# Patient Record
Sex: Female | Born: 1950
Health system: Northeastern US, Academic
[De-identification: ages and names within clinical notes are randomized; demographics above are authoritative.]

---

## 2004-02-29 IMAGING — US US PELVIS COMPLETE
2 series · 14 of 25 positions shown · non-contrast
Comparison: none

CLINICAL DATA: Abnormal vaginal bleeding for three weeks with a hemoglobin of 7.8.  LMP 09/17/03.  The patient has had passage of clots with her bleeding.
PELVIC ULTRASOUND:
The patient could not tolerate an endovaginal exam and this study was performed transabdominally only.
Multiple images of the uterus and adnexal were obtained.  
The uterus is enlarged with a maximal sagittal length of 12.9 cm and maximal AP width of 6.2 cm.  The endometrial canal is distended by a mixture of fluid and mixed echogenicity soft tissue.  The overall appearance suggests the presence of retained clot within the endometrial canal.  The visualized portion of the lining itself appears thin, however the evaluation of the entire lining was limited by the transabdominal only approach.   areas of focally altered echotexture compatible with fibroids are identified.  One is in the anterior upper uterine segment measuring 3.5 x 3.1 x 3.2 cm.  Because of the distended endometrial canal this fibroids relationship with the mucosal lining can be seen and approximately 50% of the fibroid appears to have a submucosal component.  A second fibroid is identified in the posterior mid-body measuring 3.2 x 2.8 x 2.7 cm.  This fibroid as well appears to have an approximate 50% submucosal component.  A third smaller fibroid is identified in the anterior mid-body measuring 1.3 x 1.0 x 1.0 cm and appearing mural and a fourth mural fibroid is suggested in the posterior lower uterine segment measuring 2.8 x 2.2 x 2.6 cm.  MRI may be helpful for evaluation of the uterus and fibroid sizes and locations, as well as better visualization of the endometrial lining, as the patient was unable to tolerate the vaginal exam.  
The right ovary has a normal appearance measuring 2.6 x 2.2 x 3.3 cm.  The left ovary contains a unilocular simple cyst measuring 3.6 x 3.7 x 3.7 cm.  This has a benign appearance and follow-up evaluation in 4 to 6 weeks is recommended for initial short-term reassessment of this suspected benign process.  
IMPRESSION
Enlarged uterus with a distended endometrial canal containing fluid and mixed echogenicity soft tissues suggesting the presence of retained clot.  
Four focal fibroids with sizes and locations as noted above.  At least two of the fibroids appear to have a substantial submucosal component and may have some degree of relationship with the patient?s vaginal bleeding and apparent large amount of intraluminal clot.  
Simple left ovarian cyst.  Follow-up is recommended in 4 to 6 weeks for initial short-term reassessment.  Normal left ovary.  
The cervix was incompletely evaluated due to the transabdominal only approach.  t does not appear enlarged transabdominally and there is no obvious explanation for the retained clot seen on this study.

[Series 1: unknown · 0.30mm/px · 9 of 26 slices shown]
[im 1/26]
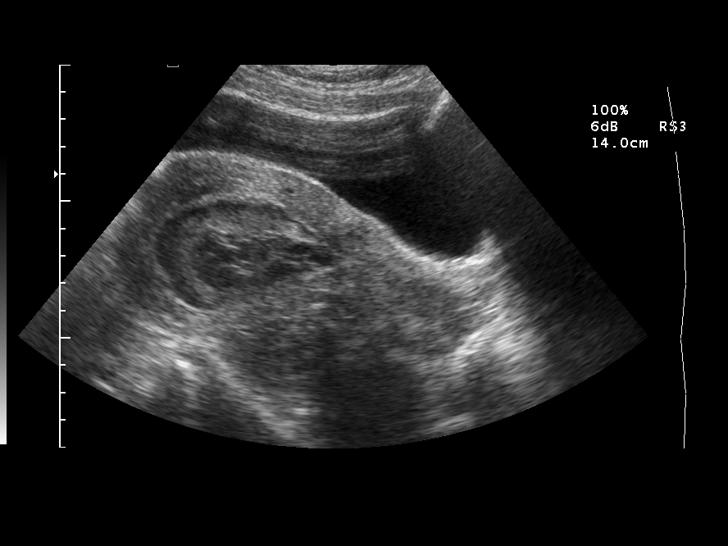
[im 4/26]
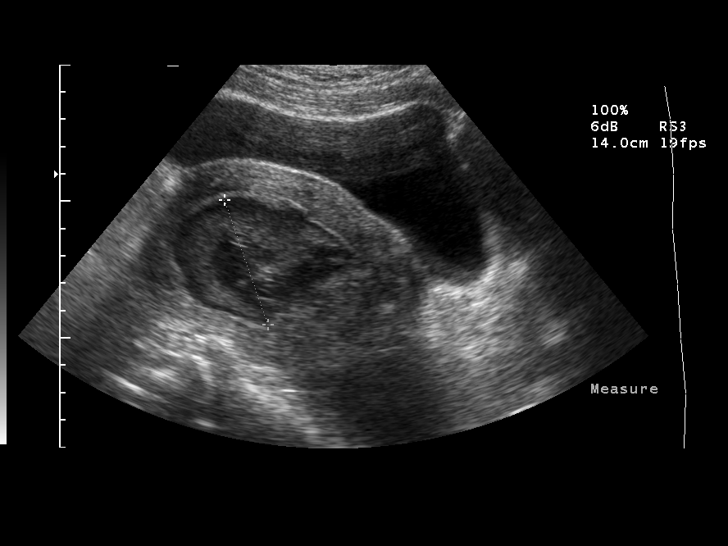
[im 7/26]
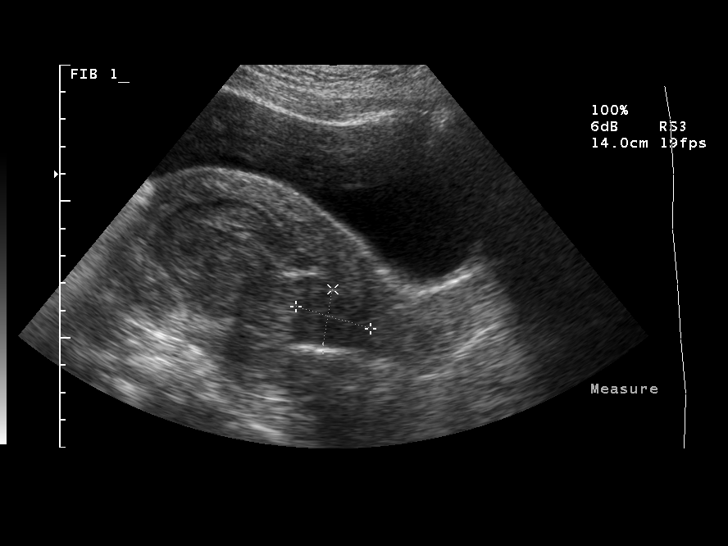
[im 11/26]
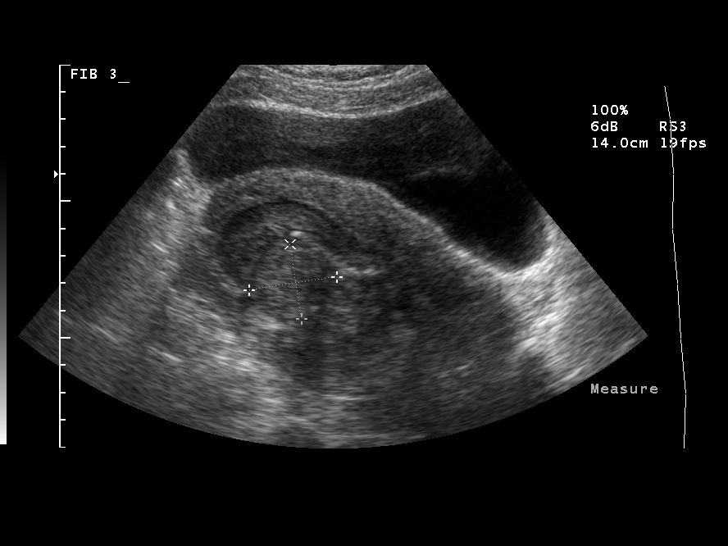
[im 14/26]
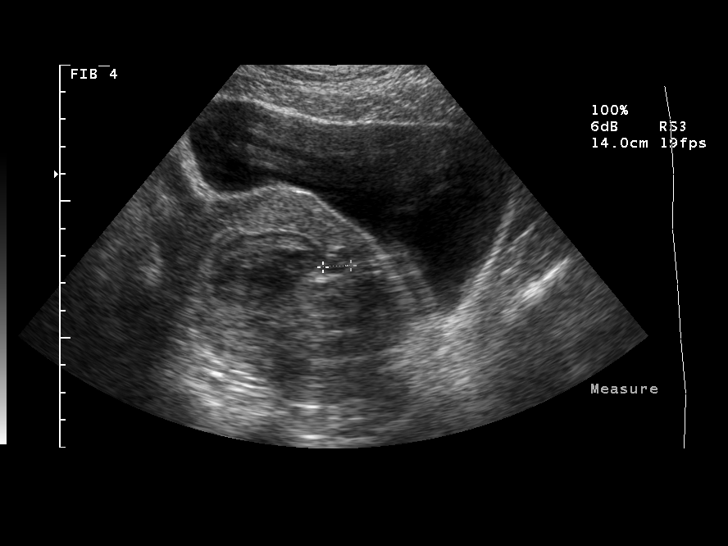
[im 16/26]
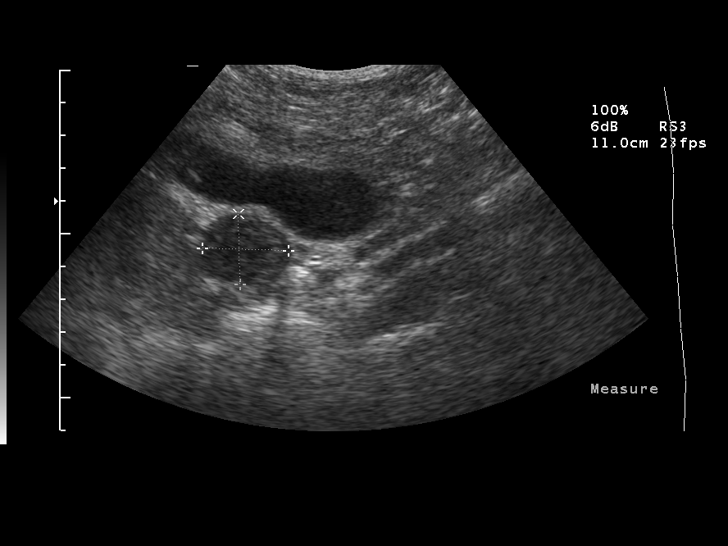
[im 19/26]
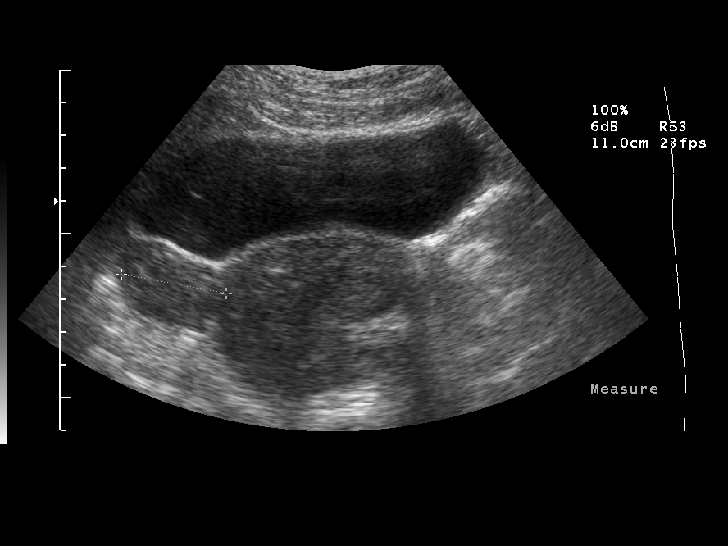
[im 22/26]
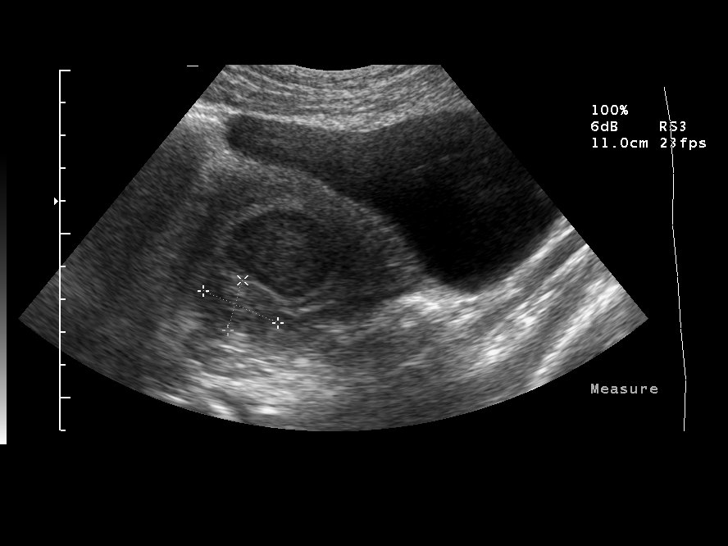
[im 26/26]
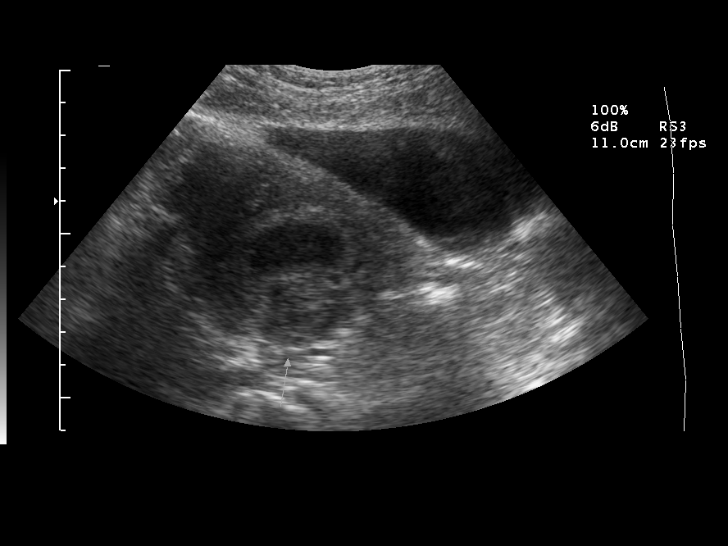

[Series 1: us transvaginal non-ob · 5 of 15 slices shown]
[im 1/15]
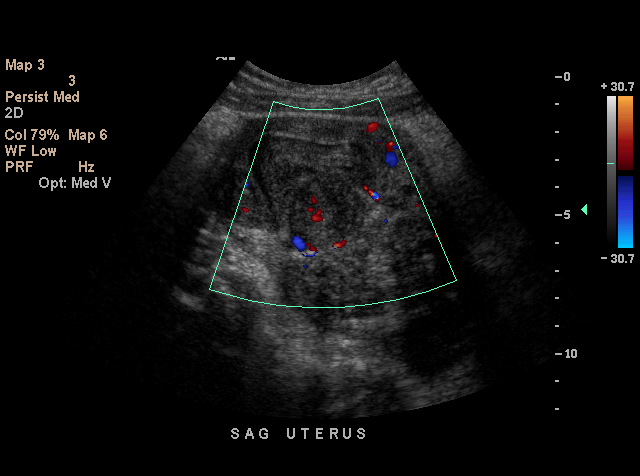
[im 4/15]
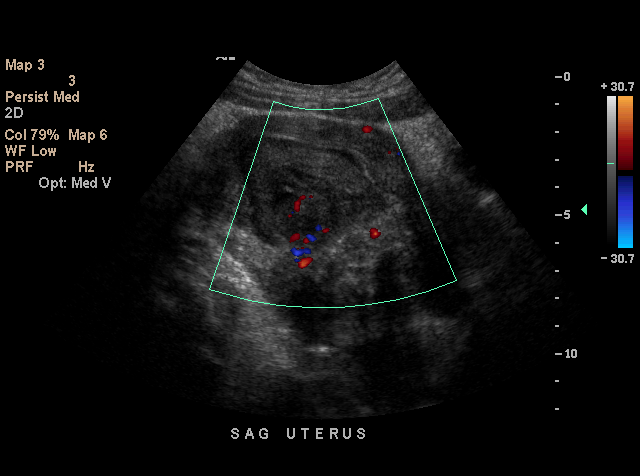
[im 8/15]
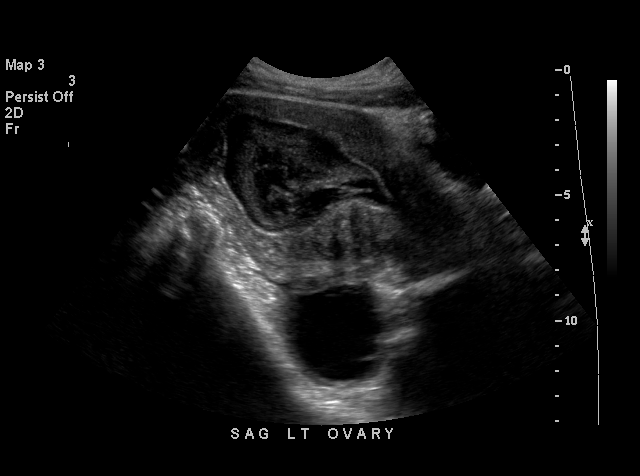
[im 11/15]
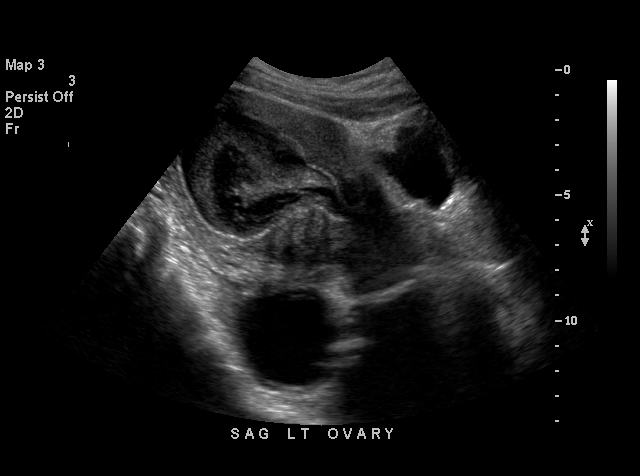
[im 15/15]
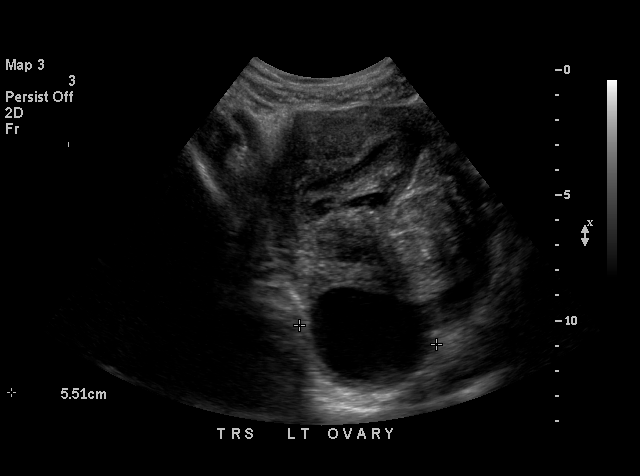

[14 of 25 positions shown; findings below may reference images not displayed]

## 2004-03-12 IMAGING — US US TRANSVAGINAL NON-OB
1 series · 18 of 25 positions shown · non-contrast
Comparison: none

CLINICAL DATA: 52-year-old with fibroids.  Menorrhagia.  Status post transfusion for vaginal bleeding.  
 TRANSABDOMINAL AND TRANSVAGINAL PELVIC ULTRASOUND:
 Comparison 10/05/03.
 Transabdominal and endovaginal images are performed of the pelvis.  
 There is increased echogenic endometrial clot on the current study, making it more difficult to identified a discrete fibroid.  However, two central fundal fibroids are again identified in the upper uterus, not significantly changed in appearance.  Smaller lower anterior fibroid is also seen.  On today?s exam, there is a pedunculated right anterior fibroid, in the mid region of the uterus measuring 2.6 x 2.1 x 2.0 cm. 
 The left ovary is 4.3 x 1.2 x 2.7 cm.  Small cysts are seen on the left.  The largest measures 2.7 cm on today?s exam, showing decrease in size since the prior exam.  The right ovary is felt to be located posterior along the inferior aspect of the uterus, lateral to the left ovary measuring 3.6 x 1.8 x 2.1 cm.  No free pelvic fluid is identified.  
 IMPRESSION
 Multiple uterine fibroids.  There has decrease in size of left ovarian cyst.  No definite necrotic or infarcted fibroids are identified.

[Series 1: us pelvis complete · 18 of 58 slices shown]
[im 1/58]
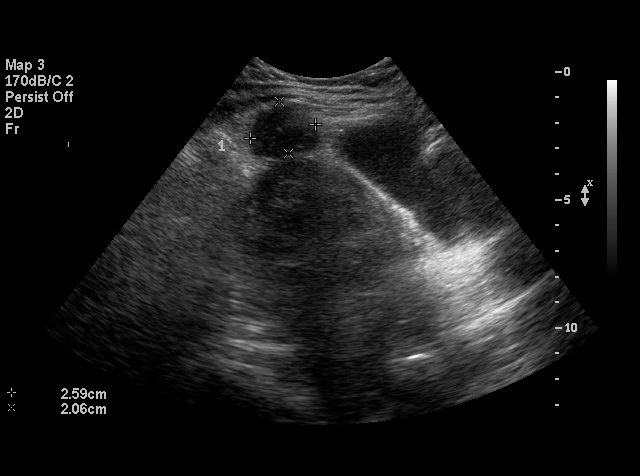
[im 5/58]
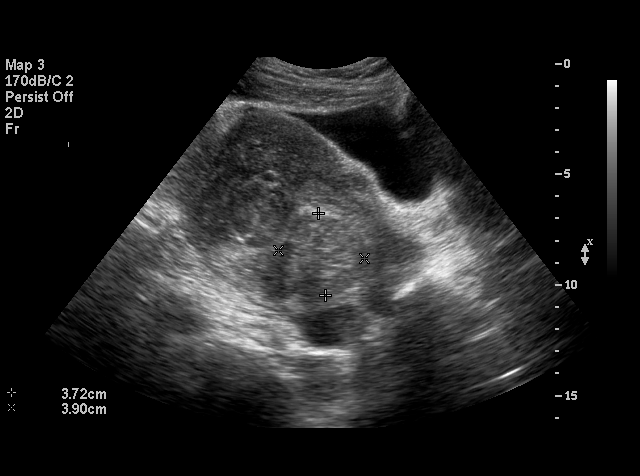
[im 8/58]
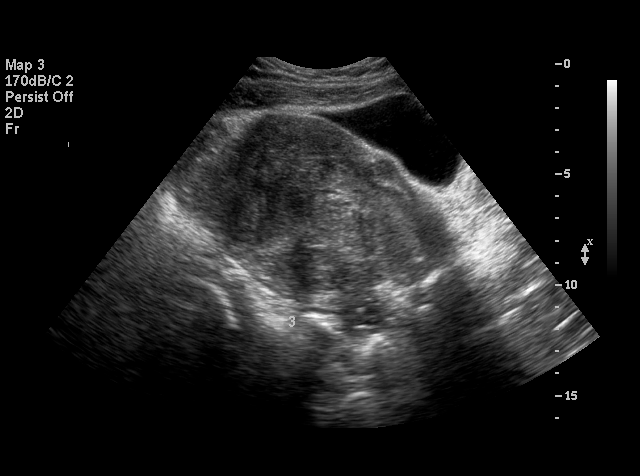
[im 10/58]
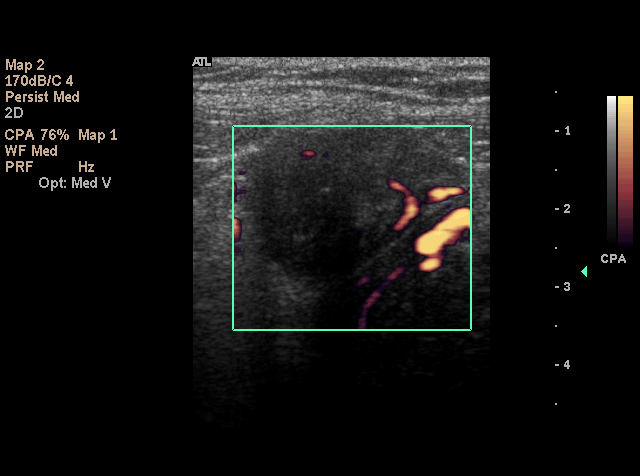
[im 15/58]
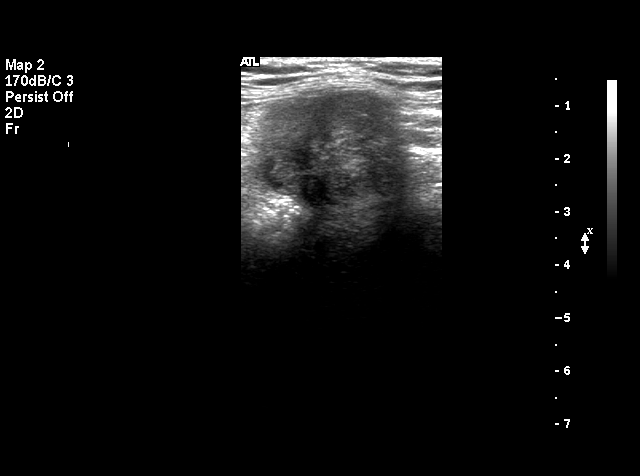
[im 17/58]
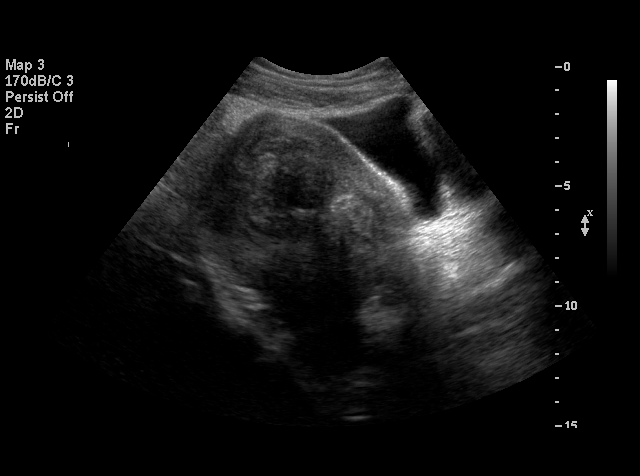
[im 22/58]
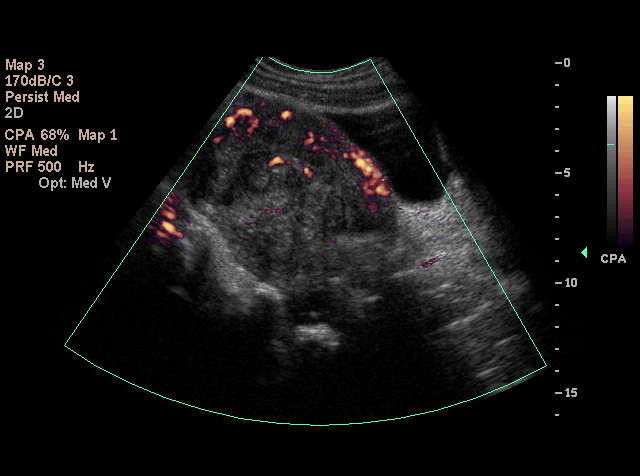
[im 24/58]
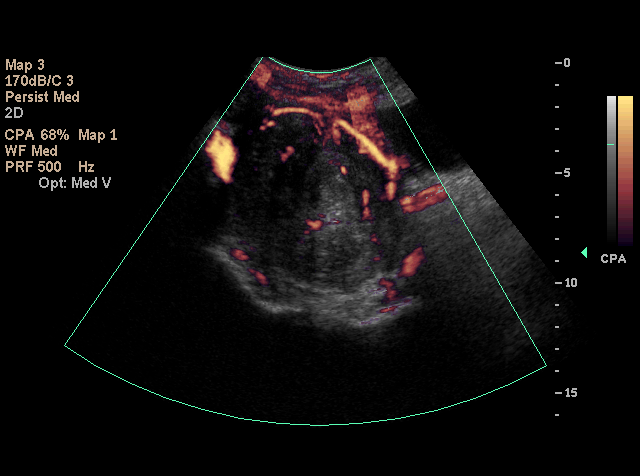
[im 27/58]
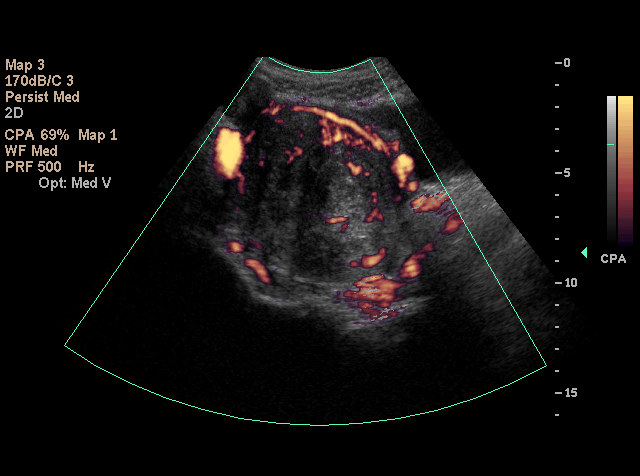
[im 31/58]
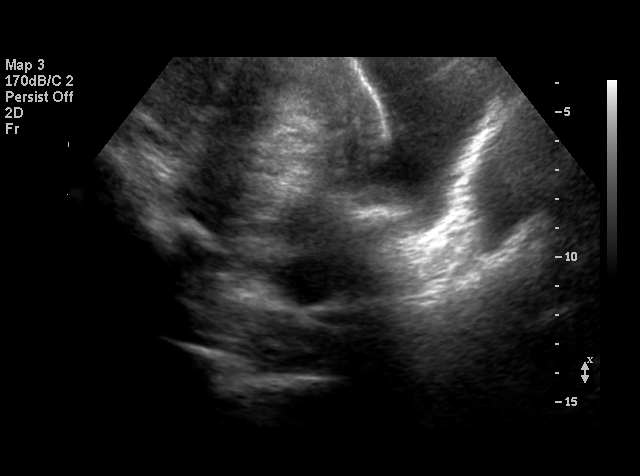
[im 34/58]
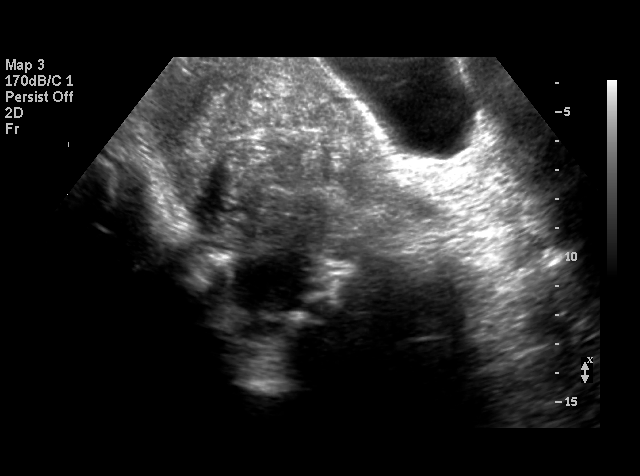
[im 36/58]
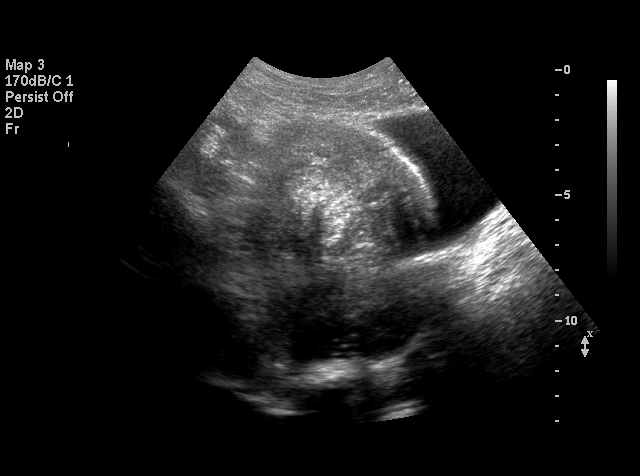
[im 41/58]
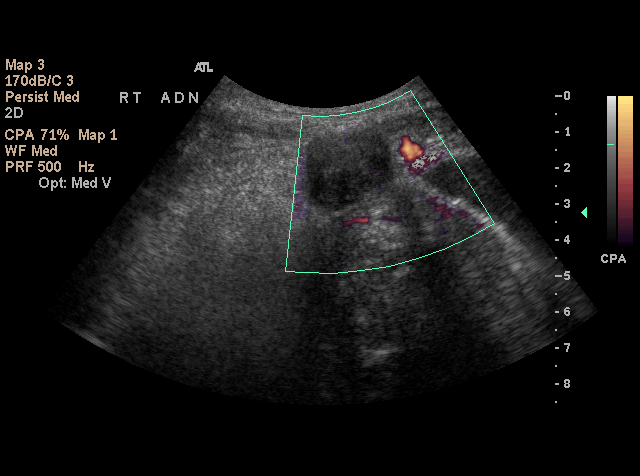
[im 43/58]
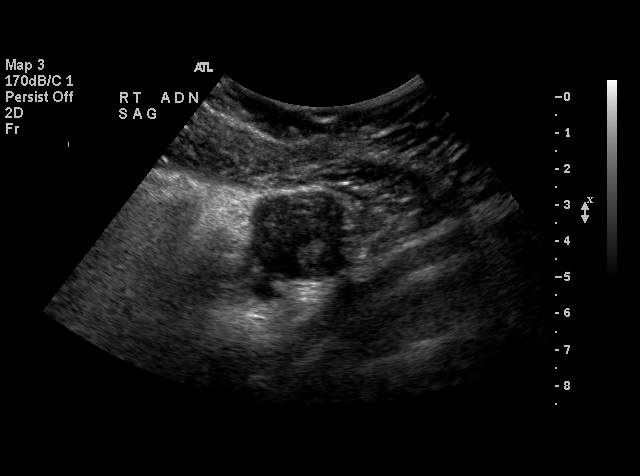
[im 48/58]
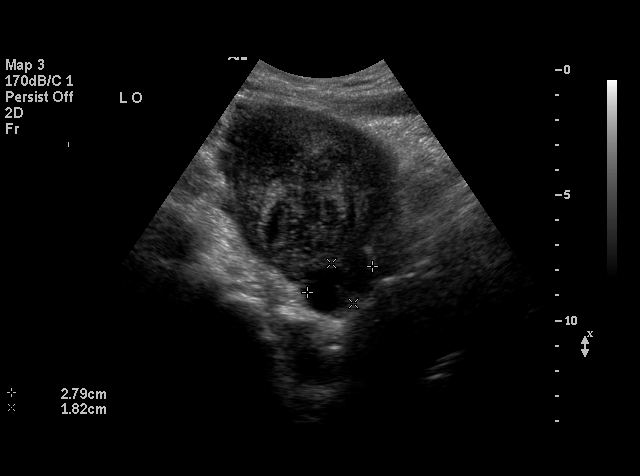
[im 50/58]
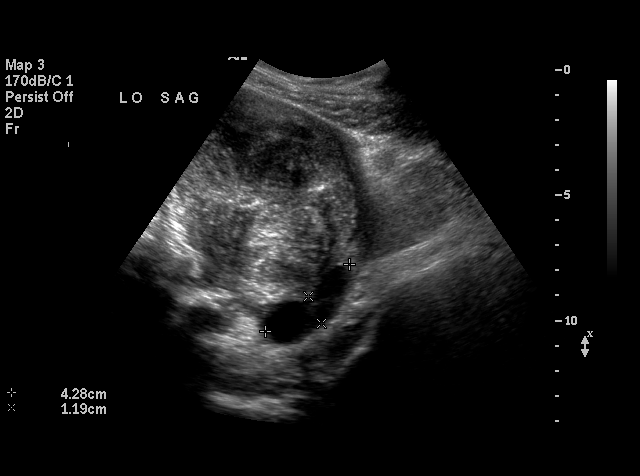
[im 53/58]
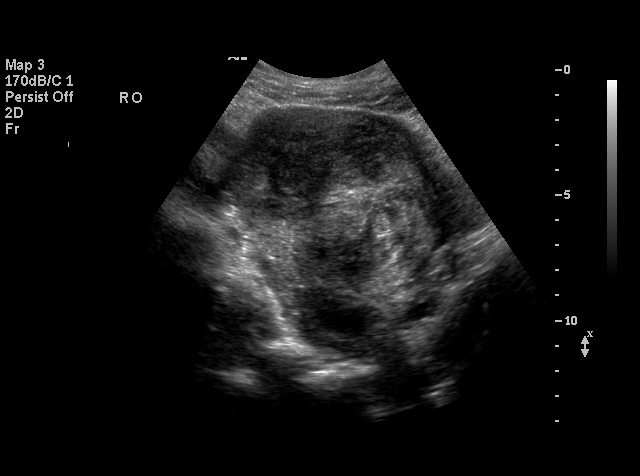
[im 58/58]
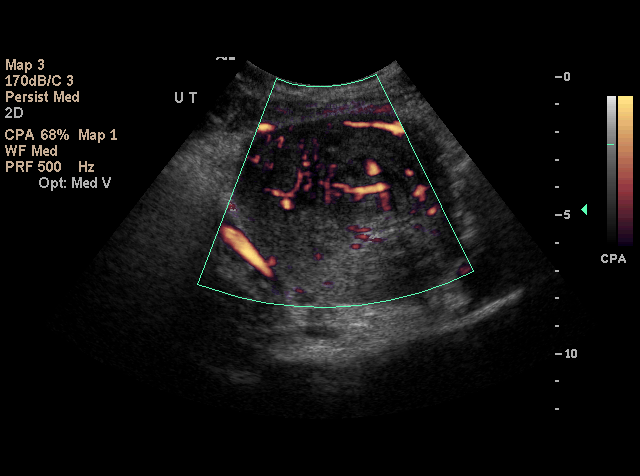

[18 of 25 positions shown; findings below may reference images not displayed]

## 2018-12-20 LAB — LIPID PROFILE (EXT)
Cholesterol (EXT): 207 mg/dL — ABNORMAL HIGH (ref 140–200)
HDL Cholesterol (EXT): 85 mg/dL (ref >40)
LDL Cholesterol, CALC (EXT): 108 mg/dL (ref 0–129)
NON HDL Cholesterol (EXT): 122 mg/dL
Risk Factor (EXT): 2.4 (ref 0–4.4)
Triglycerides (EXT): 68 mg/dL (ref 0–149)

## 2018-12-20 LAB — CMP (EXT)
ALT/SGPT (EXT): 22 U/L (ref 10–49)
AST/SGOT (EXT): 25 U/L (ref 6–40)
Albumin (EXT): 4.3 g/dL (ref 3.5–4.8)
Alkaline Phosphatase (EXT): 75 U/L (ref 27–129)
Anion Gap (EXT): 10 mmol/L (ref 3–17)
BUN (EXT): 13 mg/dL (ref 9–23)
Bilirubin, Total (EXT): 0.3 mg/dL (ref 0.0–1.0)
CO2 (EXT): 29 mmol/L (ref 20–31)
CalciumCalcium (EXT): 9.7 mg/dL (ref 8.7–10.4)
Chloride (EXT): 105 mmol/L (ref 95–106)
Creatinine (EXT): 0.68 mg/dL (ref 0.50–1.30)
GFR Estimated (Calc) (EXT): 92 mL/min/1.73m2 (ref >60)
Globulin (EXT): 3.3 g/dL (ref 1.9–4.1)
Glucose (EXT): 94 mg/dL (ref 74–106)
Potassium (EXT): 5.3 mmol/L — ABNORMAL HIGH (ref 3.5–5.2)
Protein (EXT): 7.6 g/dL (ref 5.7–8.2)
Sodium (EXT): 144 mmol/L (ref 136–145)

## 2020-01-27 LAB — CMP (EXT)
ALT/SGPT (EXT): 21 U/L (ref 10–49)
AST/SGOT (EXT): 23 U/L (ref 6–40)
Albumin (EXT): 4.4 g/dL (ref 3.5–4.8)
Alkaline Phosphatase (EXT): 72 U/L (ref 27–129)
Anion Gap (EXT): 7 mmol/L (ref 3–17)
BUN (EXT): 17 mg/dL (ref 9–23)
Bilirubin, Total (EXT): <0.2 mg/dL (ref 0.0–1.0)
CO2 (EXT): 28 mmol/L (ref 20–31)
CalciumCalcium (EXT): 9.7 mg/dL (ref 8.7–10.4)
Chloride (EXT): 105 mmol/L (ref 95–106)
Creatinine (EXT): 0.67 mg/dL (ref 0.50–1.30)
GFR Estimated (Calc) (EXT): 92 mL/min/1.73m2 (ref >60)
Globulin (EXT): 2.7 g/dL (ref 1.9–4.1)
Glucose (EXT): 87 mg/dL (ref 74–106)
Potassium (EXT): 4.6 mmol/L (ref 3.5–5.2)
Protein (EXT): 7.1 g/dL (ref 5.7–8.2)
Sodium (EXT): 140 mmol/L (ref 136–145)

## 2020-12-27 ENCOUNTER — Encounter (INDEPENDENT_AMBULATORY_CARE_PROVIDER_SITE_OTHER)

## 2020-12-27 ENCOUNTER — Ambulatory Visit (HOSPITAL_BASED_OUTPATIENT_CLINIC_OR_DEPARTMENT_OTHER)

## 2020-12-27 NOTE — Progress Notes (Signed)
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

## 2021-01-31 LAB — BMP (EXT)
Anion Gap (EXT): 9 mmol/L (ref 3–17)
BUN (EXT): 11 mg/dL (ref 9–23)
CO2 (EXT): 26 mmol/L (ref 20–31)
CalciumCalcium (EXT): 9.3 mg/dL (ref 8.7–10.4)
Chloride (EXT): 106 mmol/L (ref 95–106)
Creatinine (EXT): 0.72 mg/dL (ref 0.50–1.30)
GFR Estimated (Calc) (EXT): 92 mL/min/1.73m2 (ref >60)
Glucose (EXT): 100 mg/dL (ref 74–106)
Potassium (EXT): 3.9 mmol/L (ref 3.5–5.2)
Sodium (EXT): 141 mmol/L (ref 136–145)

## 2021-02-15 LAB — LIPID PROFILE (EXT)
Cholesterol (EXT): 198 mg/dL (ref 140–200)
HDL Cholesterol (EXT): 88 mg/dL (ref >40)
LDL Cholesterol, CALC (EXT): 99 mg/dL (ref 0–129)
NON HDL Cholesterol (EXT): 110 mg/dL
Risk Factor (EXT): 2.3 (ref 0–4.4)
Triglycerides (EXT): 53 mg/dL (ref 0–149)

## 2021-12-23 ENCOUNTER — Encounter

## 2022-01-02 ENCOUNTER — Institutional Professional Consult (permissible substitution) (INDEPENDENT_AMBULATORY_CARE_PROVIDER_SITE_OTHER): Admitting: Ophthalmology

## 2023-03-24 LAB — HEMOGLOBIN A1C
Estimated Average Glucose mg/dL (INT/EXT): 123 mg/dL
HEMOGLOBIN A1C % (INT/EXT): 5.9 % — ABNORMAL HIGH (ref 4.3–5.6)

## 2023-03-24 LAB — LIPID PROFILE (EXT)
Cholesterol (EXT): 185 mg/dL (ref 140–200)
HDL Cholesterol (EXT): 95 mg/dL (ref >40)
LDL Cholesterol, CALC (EXT): 76 mg/dL (ref 0–129)
NON HDL Cholesterol (EXT): 90 mg/dL
Risk Factor (EXT): 2 (ref 0–4.4)
Triglycerides (EXT): 71 mg/dL (ref 0–149)

## 2023-03-24 LAB — CMP (EXT)
ALT/SGPT (EXT): 23 U/L (ref 10–49)
AST/SGOT (EXT): 25 U/L (ref 6–40)
Albumin (EXT): 4.4 g/dL (ref 3.5–4.8)
Alkaline Phosphatase (EXT): 79 U/L (ref 27–129)
Anion Gap (EXT): 12 mmol/L (ref 3–17)
BUN (EXT): 10 mg/dL (ref 9–23)
Bilirubin, Total (EXT): 0.2 mg/dL (ref 0.0–1.0)
CO2 (EXT): 23 mmol/L (ref 20–31)
CalciumCalcium (EXT): 9.7 mg/dL (ref 8.7–10.4)
Chloride (EXT): 103 mmol/L (ref 95–106)
Creatinine (EXT): 0.7 mg/dL (ref 0.50–1.30)
GFR Estimated (Calc) (EXT): 94 mL/min/1.73m2 (ref >60)
Globulin (EXT): 2.9 g/dL (ref 1.9–4.1)
Glucose (EXT): 99 mg/dL (ref 74–106)
Potassium (EXT): 4.1 mmol/L (ref 3.5–5.2)
Protein (EXT): 7.3 g/dL (ref 5.7–8.2)
Sodium (EXT): 138 mmol/L (ref 136–145)

## 2023-04-16 LAB — HEMOGLOBIN A1C
Estimated Average Glucose mg/dL (INT/EXT): 117 mg/dL
HEMOGLOBIN A1C % (INT/EXT): 5.7 % — ABNORMAL HIGH (ref 4.3–5.6)

## 2023-09-23 LAB — BMP (EXT)
Anion Gap (EXT): 11 mmol/L (ref 3–17)
BUN (EXT): 13 mg/dL (ref 9–23)
CO2 (EXT): 26 mmol/L (ref 20–31)
CalciumCalcium (EXT): 9.7 mg/dL (ref 8.7–10.4)
Chloride (EXT): 103 mmol/L (ref 95–106)
Creatinine (EXT): 0.74 mg/dL (ref 0.50–1.30)
GFR Estimated (Calc) (EXT): 87 mL/min/1.73m2 (ref >60)
Glucose (EXT): 93 mg/dL (ref 74–106)
Potassium (EXT): 4.7 mmol/L (ref 3.5–5.2)
Sodium (EXT): 140 mmol/L (ref 136–145)

## 2023-09-23 LAB — HEMOGLOBIN A1C
Estimated Average Glucose mg/dL (INT/EXT): 126 mg/dL
HEMOGLOBIN A1C % (INT/EXT): 6 % — ABNORMAL HIGH (ref 4.3–5.6)

## 2024-01-28 LAB — HEMOGLOBIN A1C
Estimated Average Glucose mg/dL (INT/EXT): 123 mg/dL
HEMOGLOBIN A1C % (INT/EXT): 5.9 % — ABNORMAL HIGH (ref 4.3–5.6)

## 2024-04-15 LAB — HEMOGLOBIN A1C
Estimated Average Glucose mg/dL (INT/EXT): 123 mg/dL
HEMOGLOBIN A1C % (INT/EXT): 5.9 % — ABNORMAL HIGH (ref 4.3–5.6)

## 2024-04-15 LAB — CMP (EXT)
ALT/SGPT (EXT): 16 U/L (ref 10–49)
AST/SGOT (EXT): 26 U/L (ref 6–40)
Albumin (EXT): 4.2 g/dL (ref 3.5–4.8)
Alkaline Phosphatase (EXT): 75 U/L (ref 27–129)
Anion Gap (EXT): 12 mmol/L (ref 3–17)
BUN (EXT): 12 mg/dL (ref 9–23)
Bilirubin, Total (EXT): 0.2 mg/dL (ref 0.0–1.0)
CO2 (EXT): 24 mmol/L (ref 20–31)
CalciumCalcium (EXT): 9.4 mg/dL (ref 8.7–10.4)
Chloride (EXT): 104 mmol/L (ref 95–106)
Creatinine (EXT): 0.66 mg/dL (ref 0.50–1.30)
GFR Estimated (Calc) (EXT): 94 mL/min/1.73m2 (ref >60)
Globulin (EXT): 2.9 g/dL (ref 1.9–4.1)
Glucose (EXT): 88 mg/dL (ref 74–106)
Potassium (EXT): 4.4 mmol/L (ref 3.5–5.2)
Protein (EXT): 7.1 g/dL (ref 5.7–8.2)
Sodium (EXT): 140 mmol/L (ref 136–145)

## 2024-04-15 LAB — LIPID PROFILE (EXT)
Cholesterol (EXT): 181 mg/dL (ref 140–200)
HDL Cholesterol (EXT): 86 mg/dL (ref >40)
LDL Cholesterol, CALC (EXT): 71 mg/dL (ref 0–129)
NON HDL Cholesterol (EXT): 95 mg/dL
Risk Factor (EXT): 2.1 (ref 0–4.4)
Triglycerides (EXT): 120 mg/dL (ref 0–149)
